# Patient Record
Sex: Female | Born: 1990 | Race: Black or African American | Hispanic: No | Marital: Single | Smoking: Former smoker
Health system: Southern US, Community
[De-identification: ages and names within clinical notes are randomized; demographics above are authoritative.]

## PROBLEM LIST (undated history)

## (undated) DIAGNOSIS — H9209 Otalgia, unspecified ear: Secondary | ICD-10-CM

## (undated) HISTORY — PX: INNER EAR SURGERY: SHX679

---
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
## 2012-02-20 IMAGING — CR DG CHEST 2V
1 series · 2 of 2 positions shown · non-contrast
Comparison: none

REASON FOR EXAM: sob
COMMENTS:

PROCEDURE:     DXR - DXR CHEST PA (OR AP) AND LATERAL  - August 31, 2010  [DATE]
RESULT:     The lungs are clear. The cardiac silhouette and visualized bony
skeleton are unremarkable.

[Series 1: view not recorded · 0.17mm/px · 2 of 2 slices shown]
[im 1/2]
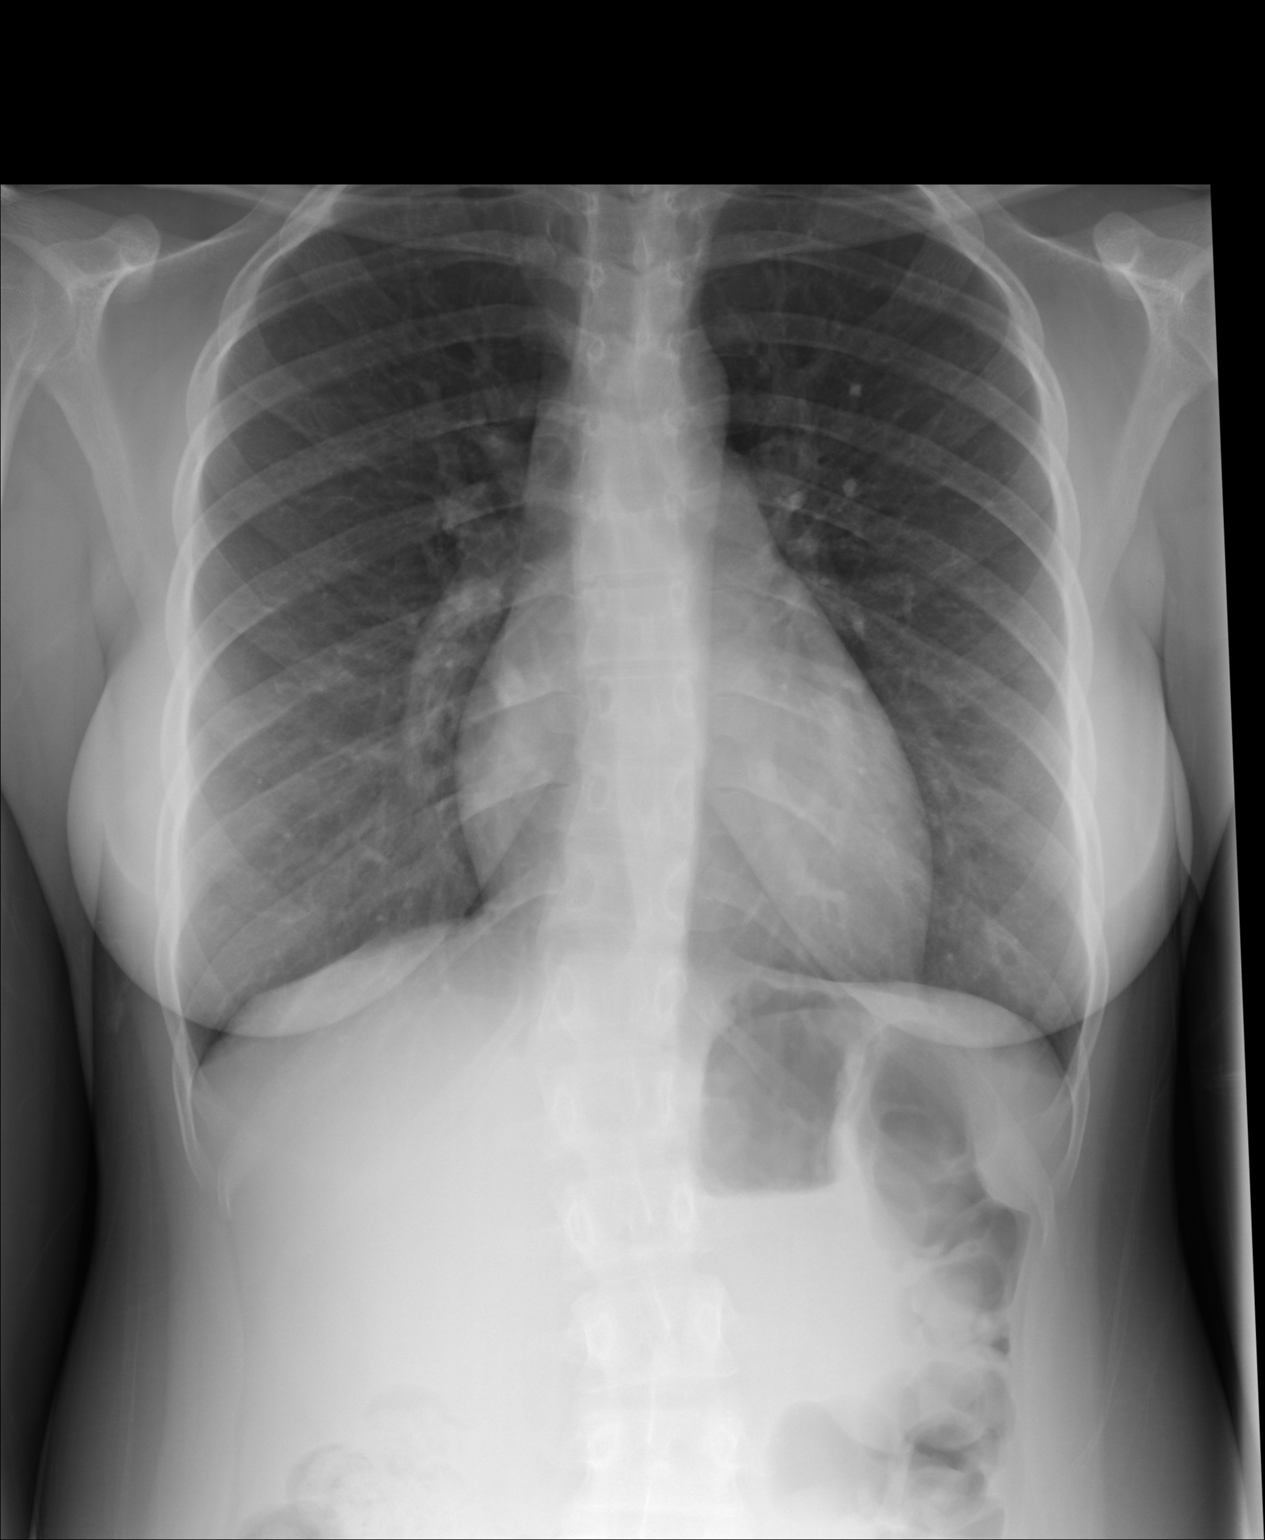
[im 2/2]
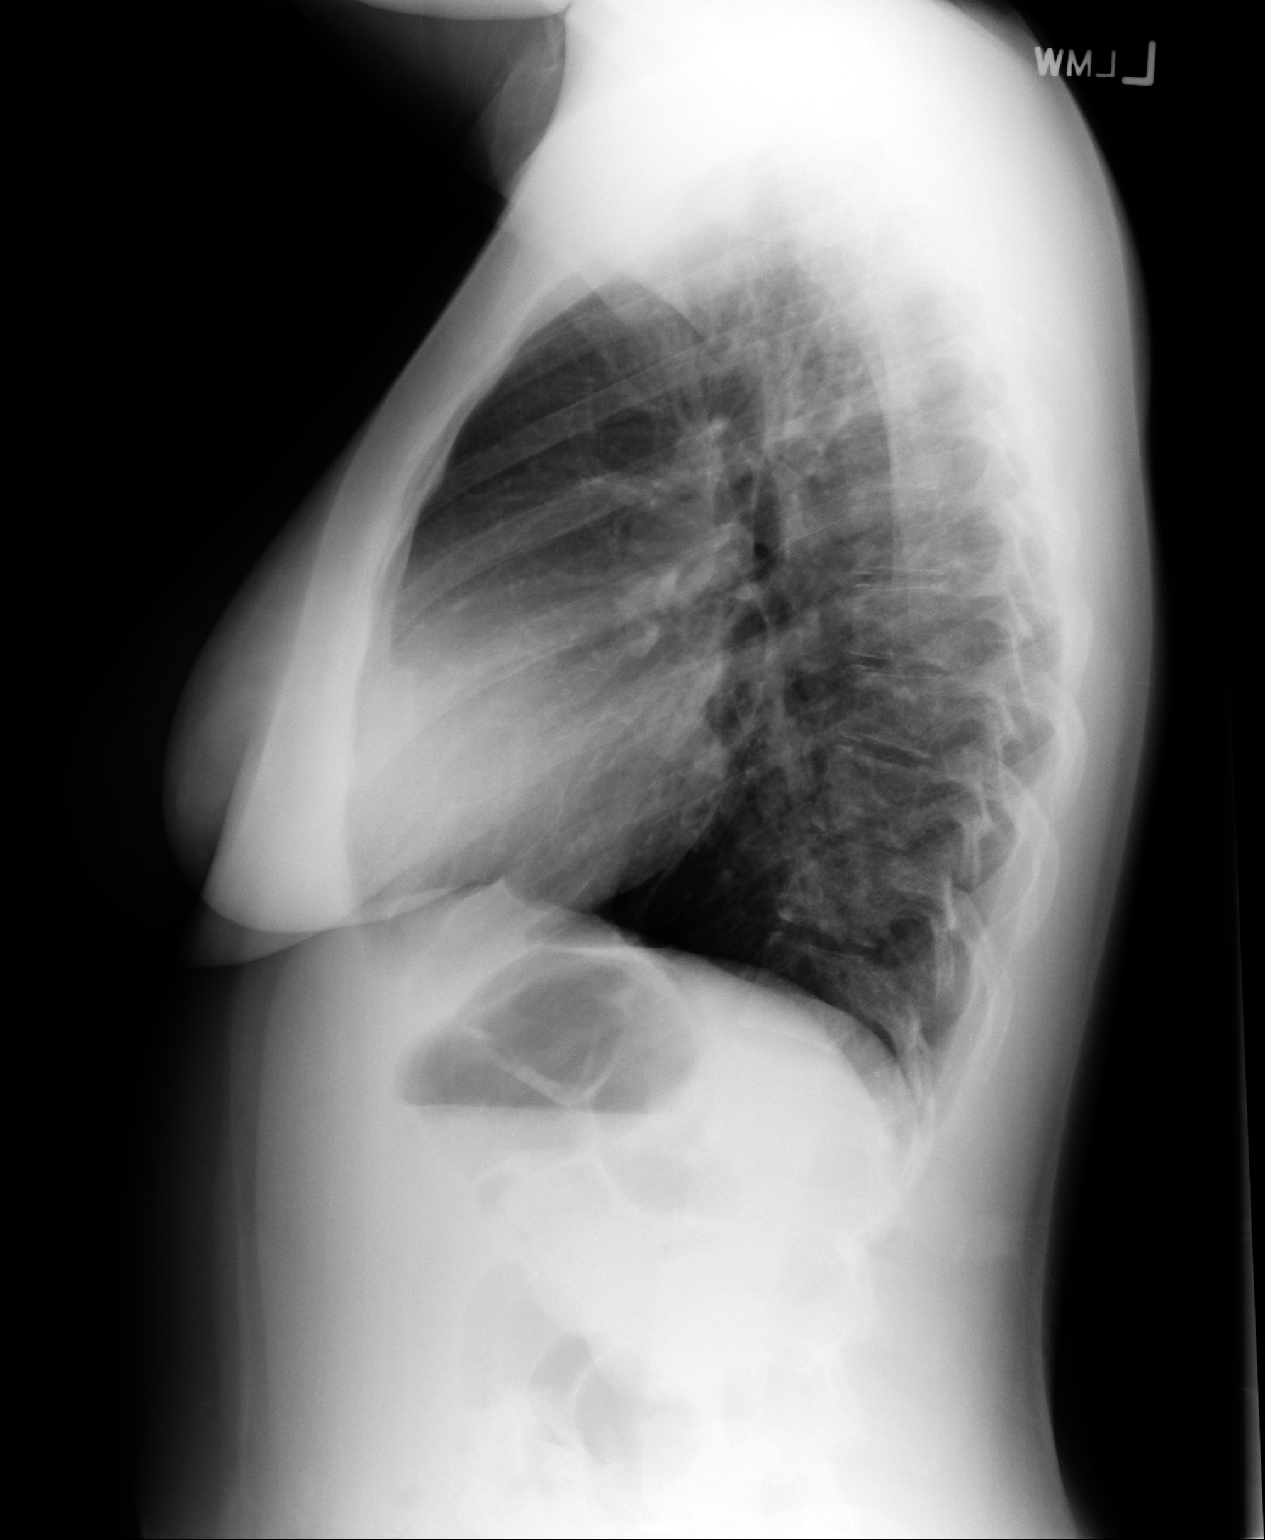

[2 of 2 positions shown; findings below may reference images not displayed]

IMPRESSION: 1. Chest radiograph without evidence of acute cardiopulmonary disease.
2. Comparison made to prior study dated 08/25/2010.

## 2014-11-03 ENCOUNTER — Ambulatory Visit: Payer: Self-pay | Admitting: Physician Assistant

## 2014-11-03 LAB — URINALYSIS, COMPLETE
Bilirubin,UR: NEGATIVE
Glucose,UR: NEGATIVE
Ketone: NEGATIVE
Nitrite: NEGATIVE
PH: 7.5 (ref 5.0–8.0)
PROTEIN: NEGATIVE
Specific Gravity: 1.015 (ref 1.000–1.030)
WBC UR: >30 /HPF (ref 0–5)

## 2014-11-03 LAB — GC/CHLAMYDIA PROBE AMP

## 2015-08-10 ENCOUNTER — Ambulatory Visit
Admission: EM | Admit: 2015-08-10 | Discharge: 2015-08-10 | Disposition: A | Discharge status: Home / Self Care | Payer: 59 | Attending: Family Medicine | Admitting: Family Medicine

## 2015-08-10 ENCOUNTER — Encounter: Payer: Self-pay | Admitting: *Deleted

## 2015-08-10 DIAGNOSIS — H9203 Otalgia, bilateral: Secondary | ICD-10-CM | POA: Diagnosis not present

## 2015-08-10 DIAGNOSIS — Z872 Personal history of diseases of the skin and subcutaneous tissue: Secondary | ICD-10-CM | POA: Diagnosis not present

## 2015-08-10 DIAGNOSIS — L218 Other seborrheic dermatitis: Secondary | ICD-10-CM | POA: Diagnosis not present

## 2015-08-10 HISTORY — DX: Otalgia, unspecified ear: H92.09

## 2015-08-10 MED ORDER — MOMETASONE FUROATE 0.1 % EX CREA: 1.0000 "application " | TOPICAL_CREAM | Freq: Every day | CUTANEOUS | Status: AC

## 2015-08-10 MED ORDER — LORATADINE 10 MG PO TABS: 10.0000 mg | ORAL_TABLET | Freq: Every day | ORAL | Status: AC

## 2015-08-10 MED ORDER — COAL TAR EXTRACT 1 % EX SHAM: MEDICATED_SHAMPOO | CUTANEOUS | Status: AC

## 2015-08-10 MED ORDER — RANITIDINE HCL 150 MG PO CAPS: 150.0000 mg | ORAL_CAPSULE | Freq: Two times a day (BID) | ORAL | Status: AC

## 2015-08-10 MED ORDER — KETOCONAZOLE 2 % EX SHAM: 1.0000 "application " | MEDICATED_SHAMPOO | CUTANEOUS | Status: AC

## 2015-08-10 NOTE — ED Notes (Signed)
Patient started having left ear pain for the last few months. Patient has a history of left ear surgery X5 and has admitted to cleaning her ears with a bobby pen. Patient started having rash months ago and has used various home remedies to resolve rash without success.

## 2015-08-10 NOTE — Discharge Instructions (Signed)
Seborrheic Dermatitis Seborrheic dermatitis involves pink or red skin with greasy, flaky scales. It often occurs where there are more oil (sebaceous) glands. This condition is also known as dandruff. When this condition affects a baby's scalp, it is called cradle cap. It may come and go for no known reason. It can occur at any time of life from infancy to old age. TREATMENT  Cortisone (steroid) ointments, creams, and lotions can help decrease inflammation.  Babies can be treated with baby oil to soften the scales, then they may be washed with baby shampoo. If this does not help, a prescription topical steroid medicine may work.  Adults can use medicated shampoos.  Your caregiver may prescribe corticosteroid cream and shampoo containing an antifungal or yeast medicine (ketoconazole). Hydrocortisone or anti-yeast cream can be rubbed directly into seborrheic dermatitis patches. Yeast does not cause seborrheic dermatitis, but it seems to add to the problem.  In infants, do not aggressively remove the scales or flakes on the scalp with a comb or by other means. This may lead to hair loss. SEEK MEDICAL CARE IF:   The problem does not improve from the medicated shampoos, lotions, or other medicines given by your caregiver.  You have any other questions or concerns. Document Released: 12/05/2004 Document Revised: 04/28/2012 Document Reviewed: 03/19/2010 [facility] Patient Information 2015 [place], [place]. This information is not intended to replace advice given to you by your health care provider. Make sure you discuss any questions you have with your health care provider. Hives Hives are itchy, red, puffy (swollen) areas of the skin. Hives can change in size and location on your body. Hives can come and go for hours, days, or weeks. Hives do not spread from person to person (noncontagious). Scratching, exercise, and stress can make your hives worse. HOME CARE  Avoid things that cause your hives  (triggers).  Take antihistamine medicines as told by your doctor. Do not drive while taking an antihistamine.  Take any other medicines for itching as told by your doctor.  Wear loose-fitting clothing.  Keep all doctor visits as told. GET HELP RIGHT AWAY IF:   You have a fever.  Your tongue or lips are puffy.  You have trouble breathing or swallowing.  You feel tightness in the throat or chest.  You have belly (abdominal) pain.  You have lasting or severe itching that is not helped by medicine.  You have painful or puffy joints. These problems may be the first sign of a life-threatening allergic reaction. Call your local emergency services (911 in [place] MAKE SURE YOU:   Understand these instructions.  Will watch your condition.  Will get help right away if you are not doing well or get worse. Document Released: 08/06/2008 Document Revised: 04/28/2012 Document Reviewed: 01/21/2012 [facility] Patient Information 2015 [place], [place]. This information is not intended to replace advice given to you by your health care provider. Make sure you discuss any questions you have with your health care provider.

## 2015-08-10 NOTE — ED Provider Notes (Addendum)
CSN: [id number]     Arrival date & time 08/10/15  0847 History   First MD Initiated Contact with Patient 08/10/15 1007     Chief Complaint  Patient presents with  . Otalgia  . Rash   patient is a recent graduate presents because of a persistent irritation of her ears and rash on her chest and back. She was seen at student health and may have to having this problem for months. She was placed on some Claritin or loratadine but didn't do much to improve her symptoms. States rash is on her chest back and her bottom. Her ears also been bothering her as well as well as irritation in both ears and ear canals. She states she has a history of urticaria and some history but this rash is not better urticaria or eczema rash she's had before. Urticaria she says occurs when she has pressure on his skin or someone touches her or she bumps the thing that comes and goes this rash has not disappeared and come back. (Consider location/radiation/quality/duration/timing/severity/associated sxs/prior Treatment) Patient is a 24 y.o. female presenting with ear pain and rash. The history is provided by the patient. No language interpreter was used.  Otalgia Location:  Bilateral (L worse than the R) Behind ear: irritation. Quality:  Throbbing Severity:  No pain Onset quality: about 6 months. Duration:  6 months Timing:  Constant Progression:  Worsening Chronicity:  Chronic Context: not direct blow, not elevation change, not foreign body in ear and not loud noise   Relieved by:  Nothing Associated symptoms: rash   Associated symptoms: no abdominal pain, no fever and no rhinorrhea   Rash:    Location:  Chest, back, buttocks and leg   Quality: dryness, itchiness and redness     Duration:  8 months   Timing:  Constant   Progression:  Worsening Risk factors: no recent travel, no chronic ear infection and no prior ear surgery   Rash Associated symptoms: no abdominal pain, no fatigue and no fever     symptoms been  years since she stop smoking this is notes reviewed Past Medical History  Diagnosis Date  . Ear pain     left side   Past Surgical History  Procedure Laterality Date  . Inner ear surgery Left     X5   No family history on file. Social History  Substance Use Topics  . Smoking status: Former Games developer  . Smokeless tobacco: Never Used  . Alcohol Use: Yes   OB History    No data available     Review of Systems  Constitutional: Negative for fever, activity change and fatigue.  HENT: Positive for ear pain. Negative for rhinorrhea, sinus pressure and voice change.   Gastrointestinal: Negative for abdominal pain.  Skin: Positive for rash.  All other systems reviewed and are negative.   Allergies  Review of patient's allergies indicates no known allergies.  Home Medications   Prior to Admission medications   Medication Sig Start Date End Date Taking? Authorizing Provider  coal tar-salicylic acid (NEUTROGENA T/GEL EX ST) 1 % shampoo Apply topically 2 (two) times a week. Try to use up to 2 times weekly for the first 6-8 weeks. Recommend applying after receiving Nizoral shampoo off and set for least 10 minutes. Try to leave this on for at least 5 minutes and may use condition of choice afterwards. 08/10/15   Hassan Rowan, MD  ketoconazole (NIZORAL) 2 % shampoo Apply 1 application topically 2 (two) times  a week. Tried to leave on scalp for 10 minutes before taking shower and then follow with T-Gel shampoo ideally for 6-8 weeks and then make go to once a week 08/10/15   Hassan Rowan, MD  loratadine (CLARITIN) 10 MG tablet Take 1 tablet (10 mg total) by mouth daily. Take 1 tablet in the morning to treat urticaria for least a month 08/10/15   Hassan Rowan, MD  mometasone (ELOCON) 0.1 % cream Apply 1 application topically daily. Place on Q-tip and may place in the outer ear canal over areas of irritation 08/10/15   Hassan Rowan, MD  ranitidine (ZANTAC) 150 MG capsule Take 1 capsule (150 mg total) by  mouth 2 (two) times daily. Take for least a month to resolve and to call down urticaria 08/10/15   Hassan Rowan, MD   Meds Ordered and Administered this Visit  Medications - No data to display  BP 126/84 mmHg  Pulse 84  Temp(Src) 98.2 F (36.8 C) (Oral)  Resp 18  Ht  (1.651 m)  Wt 165 lb (74.844 kg)  BMI 27.46 kg/m2  SpO2 100%  LMP 07/11/2015 No data found.   Physical Exam  Constitutional: She is oriented to person, place, and time. She appears well-developed and well-nourished.  HENT:  Head: Normocephalic and atraumatic.  Right Ear: Hearing and tympanic membrane normal.  Left Ear: Hearing and tympanic membrane normal.  In both ear canals there is some hard wax was also some more soft cheese type of wax is also flexed dentist in the ear ear canal and on the other side of the ear  Eyes: Conjunctivae are normal. Pupils are equal, round, and reactive to light.  Neck: Normal range of motion. Neck supple. No thyromegaly present.  Genitourinary:  Patient declined this part of her examination  Musculoskeletal: Normal range of motion. She exhibits no edema.  Neurological: She is alert and oriented to person, place, and time.  Skin: Skin is warm. Laceration: consistent with seborrheic dermatitis. There is erythema.     Erythematous rash on the chest and back  Psychiatric: Her speech is normal and behavior is normal. Her mood appears anxious.  Vitals reviewed.   ED Course  Procedures (including critical care time)  Labs Review Labs Reviewed - No data to display  Imaging Review No results found.   Visual Acuity Review  Right Eye Distance:   Left Eye Distance:   Bilateral Distance:    Right Eye Near:   Left Eye Near:    Bilateral Near:         MDM   1. History of contact urticaria   2. Seborrhea-like dermatitis with psoriasiform elements   3. Otalgia of both ears     Splint at that this is seborrheic dermatitis and seborrheic into the ear canal causing  irritation ears as well. She states she's had identified life explained to her that the chem findings to point is causing irritation and discomfort. Will recommend Nizoral shampoo release twice a week placed shampoo on the long hair for about 10 minutes then in the shower so placed on T-Gel shampoo and then while playing T-Gel 7 scalp give her 5 tenderness and illicit SHE'S finished breast shower. She may use condition of her choice. Explained to her that option only washes her hair once a week recommend washing at least twice a week for about 6-8 weeks and then she will go back to just using both shampoos once a week. We'll place on a mild to  moderate distorted cream for the chest and back to help irritation she has now use it just once a day by Elocon cream.   For the urticaria would recommend H1 blocker and H2-blocker. Claritin once a day and Zantac 150 twice a day for least a month to calm down the urticaria. If this does not help recommend referral to dermatologist for evaluation.   She is concerned about the rash on her bottom and I can't say absolutely (seborrhea. Since wants to have that resolved as well I obtained a nurse for chaperone service but it turns out she does not want me with her private area so she is going to use the cream as mentioned above and see if that helps if not she can see her PCP for further treatment evaluation.   The nurse went  to discharge patient and found that she had already left. The nurse will contact her to try to get the prescriptions and medications to her.  Hassan Rowan, MD 08/10/15 1157  Hassan Rowan, MD 08/10/15 2141

## 2023-09-12 DEATH — deceased
